# Patient Record
Sex: Male | Born: 1960 | Race: White | Hispanic: No | Marital: Married | Smoking: Former smoker
Health system: Southern US, Community
[De-identification: ages and names within clinical notes are randomized; demographics above are authoritative.]

## PROBLEM LIST (undated history)

## (undated) DIAGNOSIS — J449 Chronic obstructive pulmonary disease, unspecified: Secondary | ICD-10-CM

---

## 2008-01-01 ENCOUNTER — Inpatient Hospital Stay: Payer: Self-pay | Admitting: Internal Medicine

## 2009-03-20 ENCOUNTER — Emergency Department: Payer: Self-pay | Admitting: Emergency Medicine

## 2009-03-22 ENCOUNTER — Emergency Department: Payer: Self-pay | Admitting: Emergency Medicine

## 2009-12-09 ENCOUNTER — Emergency Department: Payer: Self-pay | Admitting: Unknown Physician Specialty

## 2010-02-05 ENCOUNTER — Inpatient Hospital Stay: Payer: Self-pay | Admitting: Internal Medicine

## 2012-01-30 IMAGING — CR DG CHEST 2V
1 series · 2 of 2 positions shown · non-contrast
Comparison: none

REASON FOR EXAM: wheezing
COMMENTS:   May transport without cardiac monitor

[Series 1: view not recorded · 0.17mm/px · 2 of 2 slices shown]
[im 1/2]
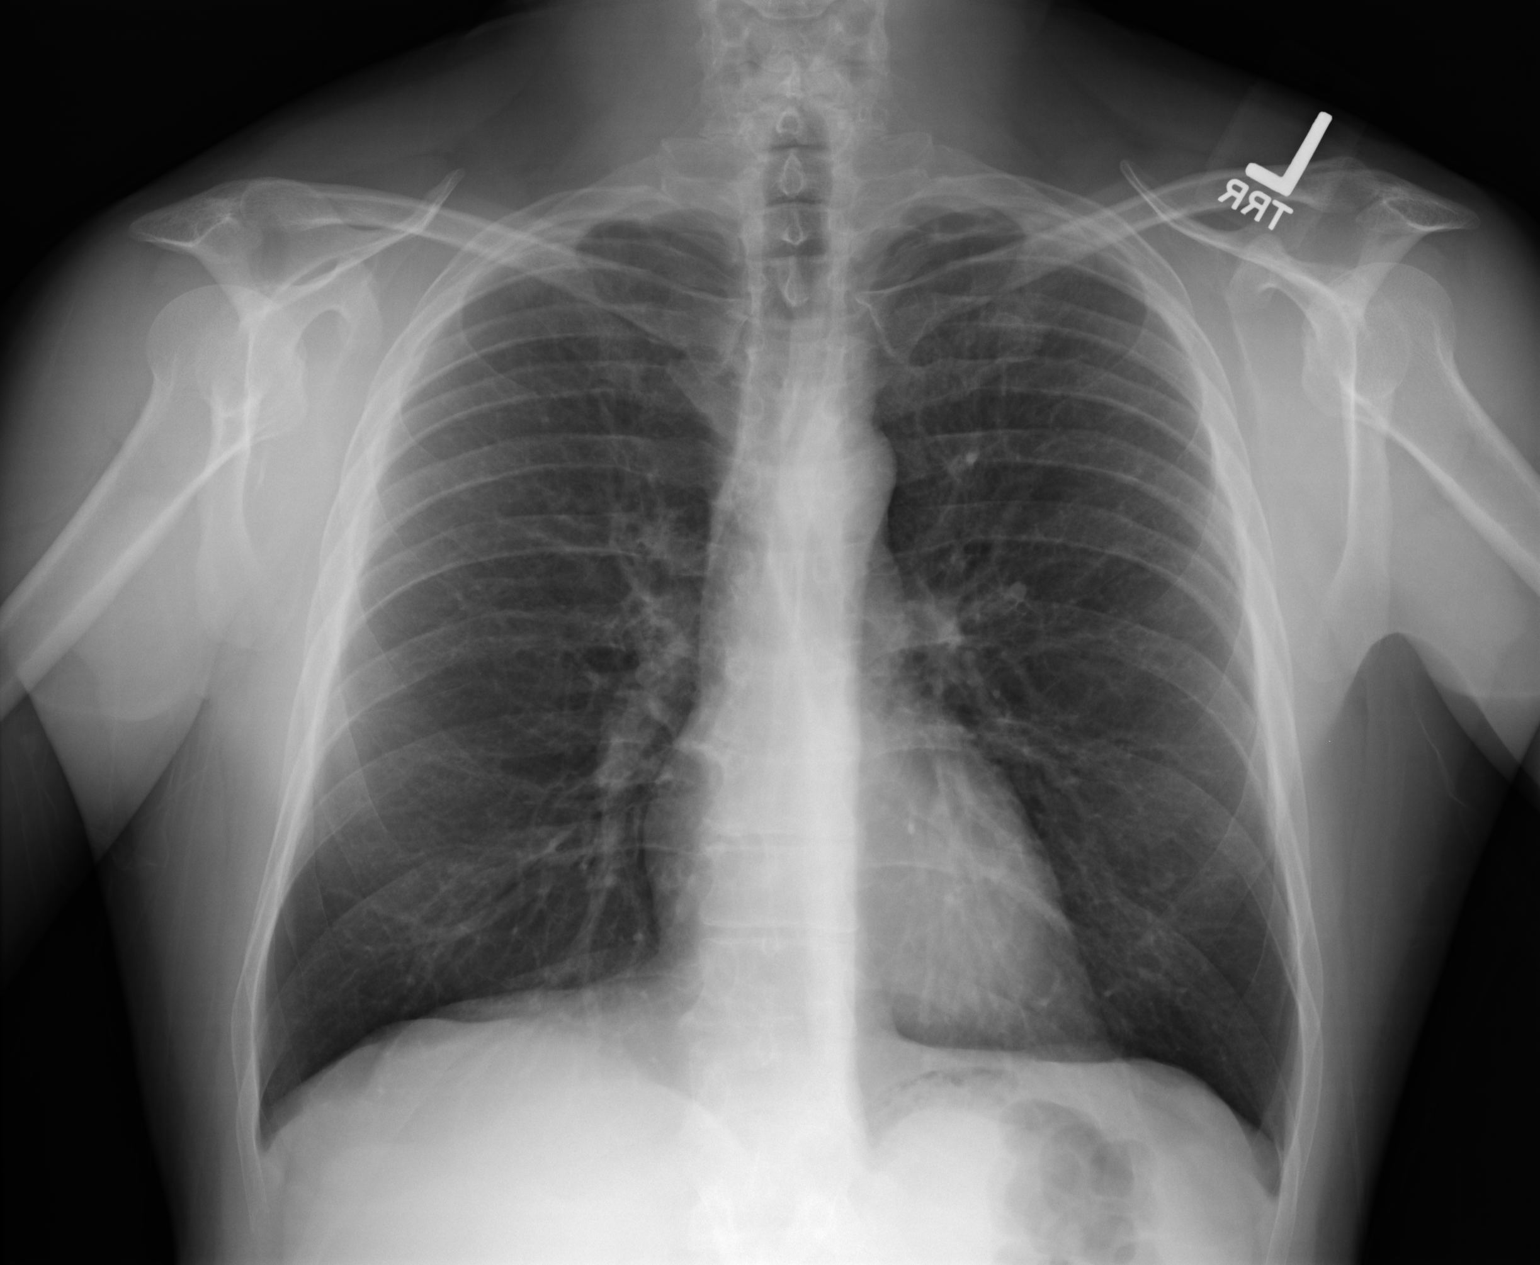
[im 2/2]
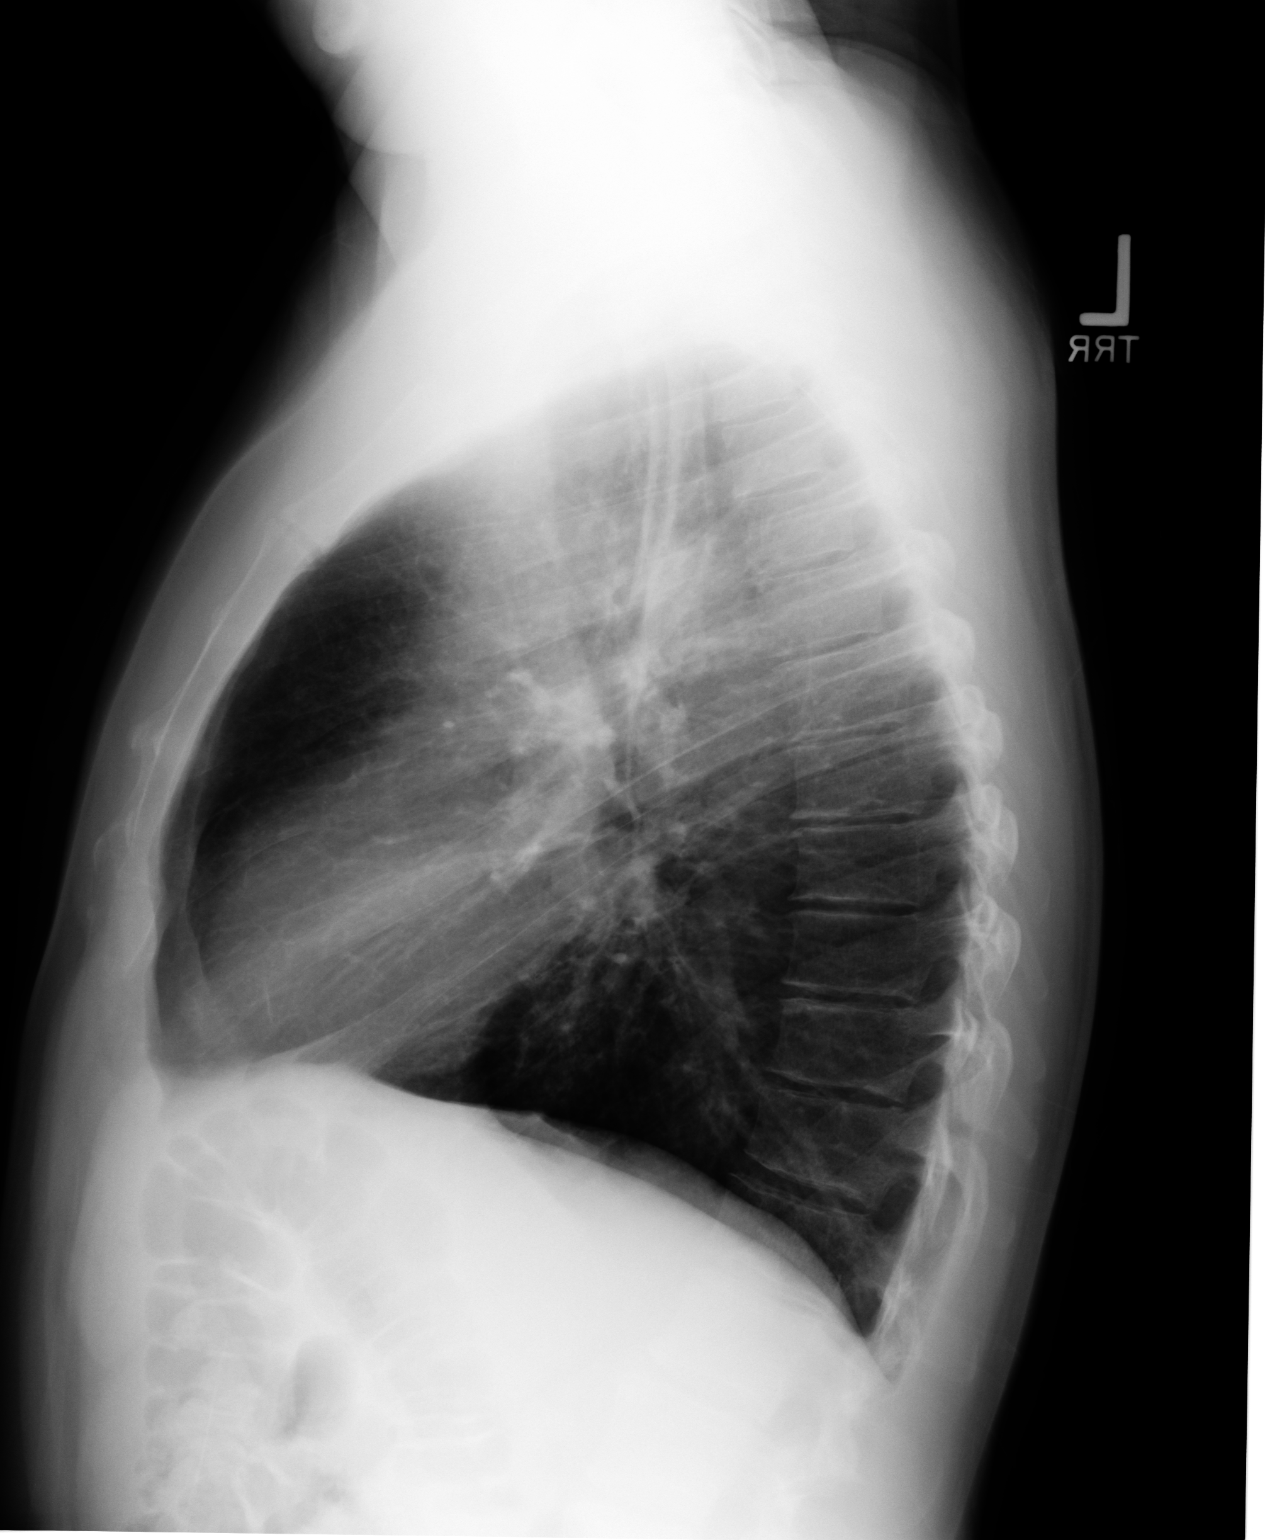

[2 of 2 positions shown; findings below may reference images not displayed]

PROCEDURE:     DXR - DXR CHEST PA (OR AP) AND LATERAL  - December 10, 2009  [DATE]

RESULT:     Comparison is made to the prior exam of 03/22/2009. The lung
fields are clear. The heart, mediastinal and osseous structures show no
significant abnormalities. The chest is hyperexpanded compatible with a
history of COPD or asthma.
IMPRESSION: 1. The lung fields are clear.
2. The chest is hyperexpanded compatible with a history of COPD or asthma.

## 2012-09-13 ENCOUNTER — Inpatient Hospital Stay: Payer: Self-pay | Admitting: Internal Medicine

## 2012-09-13 LAB — URINALYSIS, COMPLETE
Blood: NEGATIVE
Glucose,UR: NEGATIVE mg/dL (ref 0–75)
Leukocyte Esterase: NEGATIVE
Ph: 7 (ref 4.5–8.0)
Protein: NEGATIVE
RBC,UR: NONE SEEN /HPF (ref 0–5)
Squamous Epithelial: NONE SEEN
WBC UR: <1 /HPF (ref 0–5)

## 2012-09-13 LAB — CBC WITH DIFFERENTIAL/PLATELET
Basophil #: 0 10*3/uL (ref 0.0–0.1)
Basophil %: 0.7 %
Basophil %: 0.2 %
Eosinophil #: 1 10*3/uL — ABNORMAL HIGH (ref 0.0–0.7)
Eosinophil #: 0 10*3/uL (ref 0.0–0.7)
Eosinophil %: 0.1 %
HCT: 45.2 % (ref 40.0–52.0)
Lymphocyte #: 2.5 10*3/uL (ref 1.0–3.6)
MCHC: 34.8 g/dL (ref 32.0–36.0)
MCHC: 34.8 g/dL (ref 32.0–36.0)
MCV: 87 fL (ref 80–100)
Monocyte #: 0.7 x10 3/mm (ref 0.2–1.0)
Monocyte #: 0 x10 3/mm — ABNORMAL LOW (ref 0.2–1.0)
Monocyte %: 4.5 %
Monocyte %: 0.7 %
Neutrophil #: 10.7 10*3/uL — ABNORMAL HIGH (ref 1.4–6.5)
Neutrophil %: 71.4 %
Neutrophil %: 88.5 %
RDW: 12.8 % (ref 11.5–14.5)
WBC: 14.9 10*3/uL — ABNORMAL HIGH (ref 3.8–10.6)
WBC: 6.1 10*3/uL (ref 3.8–10.6)

## 2012-09-13 LAB — CK TOTAL AND CKMB (NOT AT ARMC)
CK, Total: 285 U/L — ABNORMAL HIGH (ref 35–232)
CK, Total: 264 U/L — ABNORMAL HIGH (ref 35–232)
CK-MB: 7.2 ng/mL — ABNORMAL HIGH (ref 0.5–3.6)
CK-MB: 14.1 ng/mL — ABNORMAL HIGH (ref 0.5–3.6)
CK-MB: 11.7 ng/mL — ABNORMAL HIGH (ref 0.5–3.6)

## 2012-09-13 LAB — COMPREHENSIVE METABOLIC PANEL
Albumin: 3.9 g/dL (ref 3.4–5.0)
Alkaline Phosphatase: 190 U/L — ABNORMAL HIGH (ref 50–136)
Anion Gap: 6 — ABNORMAL LOW (ref 7–16)
Calcium, Total: 9.5 mg/dL (ref 8.5–10.1)
Co2: 29 mmol/L (ref 21–32)
EGFR (Non-African Amer.): >60
Glucose: 126 mg/dL — ABNORMAL HIGH (ref 65–99)
Osmolality: 287 (ref 275–301)
Potassium: 4 mmol/L (ref 3.5–5.1)
SGPT (ALT): 20 U/L (ref 12–78)
Sodium: 143 mmol/L (ref 136–145)
Total Protein: 7.6 g/dL (ref 6.4–8.2)

## 2012-09-13 LAB — TROPONIN I
Troponin-I: <0.02 ng/mL
Troponin-I: 0.06 ng/mL — ABNORMAL HIGH

## 2012-09-13 LAB — APTT: Activated PTT: 29.7 secs (ref 23.6–35.9)

## 2012-09-14 LAB — BASIC METABOLIC PANEL
Anion Gap: 6 — ABNORMAL LOW (ref 7–16)
BUN: 11 mg/dL (ref 7–18)
Calcium, Total: 8.8 mg/dL (ref 8.5–10.1)
Chloride: 108 mmol/L — ABNORMAL HIGH (ref 98–107)
Co2: 27 mmol/L (ref 21–32)
Creatinine: 0.82 mg/dL (ref 0.60–1.30)
EGFR (African American): >60
Glucose: 139 mg/dL — ABNORMAL HIGH (ref 65–99)
Potassium: 3.7 mmol/L (ref 3.5–5.1)
Sodium: 141 mmol/L (ref 136–145)

## 2012-09-14 LAB — CBC WITH DIFFERENTIAL/PLATELET
Basophil #: 0 10*3/uL (ref 0.0–0.1)
Eosinophil #: 0 10*3/uL (ref 0.0–0.7)
HGB: 13.3 g/dL (ref 13.0–18.0)
Lymphocyte %: 10 %
MCHC: 34.8 g/dL (ref 32.0–36.0)
MCV: 87 fL (ref 80–100)
Monocyte #: 0.2 x10 3/mm (ref 0.2–1.0)
Monocyte %: 1.8 %
Neutrophil #: 8.2 10*3/uL — ABNORMAL HIGH (ref 1.4–6.5)
Platelet: 424 10*3/uL (ref 150–440)
RBC: 4.39 10*6/uL — ABNORMAL LOW (ref 4.40–5.90)

## 2012-09-14 LAB — APTT: Activated PTT: 41.5 secs — ABNORMAL HIGH (ref 23.6–35.9)

## 2013-11-17 ENCOUNTER — Emergency Department: Payer: Self-pay | Admitting: Internal Medicine

## 2014-06-22 ENCOUNTER — Emergency Department
Admit: 2014-06-22 | Disposition: A | Discharge status: Home / Self Care | Payer: Self-pay | Admitting: Physician Assistant

## 2014-07-11 NOTE — Discharge Summary (Signed)
PATIENT NAME:  Dale Page, Dale Page MR#:  [id number] DATE OF BIRTH:  02-08-1961  DATE OF ADMISSION:  09/13/2012 DATE OF DISCHARGE:  09/14/2012  ADMITTING DIAGNOSIS:  Chronic obstructive pulmonary disease exacerbation.   DISCHARGE DIAGNOSES: 1.  Acute on chronic respiratory failure.  2.  Chronic obstructive pulmonary disease exacerbation.  3.  Acute bronchitis.  4.  Non-Q-wave myocardial infarction.   DISCHARGE CONDITION:  Stable.   DISCHARGE MEDICATIONS:  The patient is to resume his outpatient medications which are:   1.  Albuterol MDIs 1 to 2 puffs every 4 to 6 hours as needed.  2.  DuoNebs one dose every six hours as needed.   3.  Symbicort 160/4.5 2 puffs twice daily.   4.  Prednisone, this is a new medication, 50 mg by mouth once on 09/15/2012, then taper by 5 mg every two days until stopped.  5.  Lisinopril 2.5 mg by mouth daily.  6. Aspirin 81 mg by mouth daily.  7.  Imdur 30 mg by mouth daily.  8.  Ipratropium 18 mcg inhalation daily.  9.  Levaquin 750 mg by mouth once daily for four more days.  10.  Atorvastatin 40 mg by mouth at bedtime.  11.  Metoprolol 25 mg by mouth twice daily.   HOME OXYGEN:  With portable tank at 2 liters of oxygen through nasal cannula as previously ordered.   DIET:  2 gram salt, low fat, low cholesterol, regular consistency.   ACTIVITY LIMITATIONS:  As tolerated.   FOLLOW-UP APPOINTMENT:  With Dr. Darrold Junker in two days after discharge.  Also [facility] primary care physician two days after discharge.   CONSULTANTS:  Cardiology, Dr. Darrold Junker.    RADIOLOGIC STUDIES:  Echocardiogram, results are still pending.  Chest x-ray, portable single view, 09/13/2012, revealed hyperinflation consistent with COPD, no evidence of CHF or pneumonia.   HOSPITAL COURSE:  The patient is a 54 year old Caucasian male with past medical history significant for history of COPD, oxygen dependent, presented to the hospital with complaints of shortness of breath.  Please  refer to Dr. Clarita Leber admission note on 09/13/2012.  On arrival to the hospital, the patient's temperature was 98.1, pulse 126, blood pressure 181/87, respiratory rate was 28 and O2 sats were 98% on BiPAP.  His physical exam revealed diffuse bilateral wheezing on lung exam and very poor air entry.  The patient's EKG done in the Emergency Room showed sinus tachy at 106 beats per minute.  Low voltage QRS, inferior infarct which was already cited before 09/13/2012 and nonspecific ST-T changes were noted.  The patient's lab data revealed normal BMP with glucose level of 126, otherwise BMP was unremarkable.  The patient's liver enzymes were unremarkable except alkaline phosphatase level was 190, elevated.  The patient's first set of cardiac enzymes revealed mild elevation of CK-MB fraction to 7.2 and normal troponin of 0.02, however second set showed elevation of CK total of 285, CK-MB was 14.1 and troponin was elevated to 0.06.  Third set revealed normalization of troponin to 0.03, MB fraction 11.7 and CK total of 264.  White blood cell count was elevated to 14.9, hemoglobin was 15.7 and platelet count was 439.  Absolute neutrophil count was also elevated at 10.7.  The patient's urinalysis was unremarkable.  ABGs were done on 40% FiO2, showed pH of 7.27, pCO2 was 58, pO2 was 172, saturation was 99.2% on BiPAP.  The patient was admitted to the hospital for further evaluation.  His cardiac enzymes were cycled and cardiology  consultation was obtained.  Cardiologist recommended to continue the patient on heparin for 24 to 48 hours and follow his symptoms.  He also recommended to proceed to cardiac catheterization if patient was agreeable, however the patient refuses that recommendation.  The patient was initiated on aspirin, Imdur, atorvastatin as well as metoprolol as well as low dose of lisinopril.  Echocardiogram was ordered, however results are still pending at the time of dictation.  The patient was recommended to  return back for further recommendations to Dr. Darrold JunkerParaschos' office in the next few days after discharge.  In regards to COPD exacerbation, the patient was continued on antibiotic therapy.  He is to continue antibiotics as well as steroid taper as well as inhalation therapy.  With this, he significantly improved.  On day of discharge he has no chest pains and his oxygenation is better.  On 09/14/2012 his temperature is 98.1, pulse was ranging from 80s to 110s, respiration rate was 16 to 17, blood pressure 124/69, saturation was 96% on 2 liters of oxygen through nasal cannula at rest.  It was felt that the patient is stable to be discharged home.  He is to follow up with cardiologist as well as his primary care physician in the next few days after discharge.  In regards to leukocytosis, the patient's white blood cell count normalized as time progressed.  On the day of discharge, 09/14/2012, the patient's white blood cell count is 9.3 with absolute neutrophil count of 8.2.   TIME SPENT:  40 minutes.   ____________________________ Katharina Caperima Ciela Mahajan, MD rv:ea D: 09/14/2012 16:19:00 ET T: 09/14/2012 22:46:23 ET JOB#: [id number]  cc: Katharina Caperima Elvert Cumpton, MD, <Dictator> Tishanna Dunford MD ELECTRONICALLY SIGNED 09/27/2012 11:36

## 2014-07-11 NOTE — H&P (Signed)
PATIENT NAME:  Dale Page, Dale Page MR#:  [id number] DATE OF BIRTH:  1960/10/25  DATE OF ADMISSION:  09/13/2012  PRIMARY CARE PHYSICIAN[facility]  [facility]   REFERRING PHYSICIAN:  Bayard Malesandolph Brown, M.D.   CHIEF COMPLAINT:  Shortness of breath.   HISTORY OF PRESENT ILLNESS:  Dale Page is a 54 year old with history of asthma as a child, severe COPD, presented to the Emergency Department with a complaint of severe shortness of breath.  The patient states ran out of his Spiriva and started to have a worsening of the shortness of breath to the point last night could not catch his breath.  The patient is currently unable to speak full sentences.  The patient's wife assists in adding details.  Has a cough with no productive sputum.  Denies having any fever.  Denies having any recent sick contacts.  At the time of the presentation, patient was in severe respiratory distress, received multiple breathing treatments and continued to be in severe respiratory distress.  Initial ABG, pH of 7.27, pCO2 of 58 and PaO2 of 172.  The patient was placed on BiPAP.  Chest x-ray does not show any obvious signs of any infection.   PAST MEDICAL HISTORY: 1.  COPD.  2.  Psoriasis.   PAST SURGICAL HISTORY:  None.   ALLERGIES:  No known drug allergies.   HOME MEDICATIONS: 1.  Symbicort 160/4.5 2 puffs 2 times a day.  2.  DuoNebs every 6 hours as needed.  3.  Albuterol 1 to 2 puffs every 4 to 6 hours as needed.   SOCIAL HISTORY:  Quit smoking in 2009.  Denies drinking alcohol or using illicit drugs.  Married, lives with his wife.   FAMILY HISTORY:  Of severe COPD, heart disease, both parents had COPD.   REVIEW OF SYSTEMS: CONSTITUTIONAL:  Generalized weakness, fatigue.  EYES:  No change in vision.  EARS, NOSE, THROAT:  No change in hearing.  No tinnitus.  RESPIRATORY:  Has cough, shortness of breath, most of the days, unable to walk long distances.  GASTROINTESTINAL:  Decreased appetite.  No nausea, vomiting,  diarrhea, abdominal pain.  GENITOURINARY:  No dysuria or hematuria.  ENDOCRINE:  No polyuria or polydipsia.  HEMATOLOGIC:  No easy bruising or bleeding.  SKIN:  No rash or lesions.  MUSCULOSKELETAL:  No joint pains or aches.  Complains of some mild back pain, started after coming to the Emergency Department.  NEUROLOGIC:  No numbness or weakness in any part of the body.  PSYCHIATRIC:  Denies having any depression, anxiety.   PHYSICAL EXAMINATION: GENERAL:  This is a thin built white male, looks much older than his stated age, lying down in the bed on BiPAP, is unable to speak full sentences.  VITAL SIGNS:  Temperature 98.1, pulse 126, blood pressure 181/87, respiratory rate of 28, oxygen saturation is 98% on BiPAP.  HEENT:  Head normocephalic, atraumatic.  Eyes, no sclerae icterus.  Conjunctivae normal.  Pupils equal and react to light.  Extraocular movements are intact.  Mucous membranes moist.  No pharyngeal erythema.  NECK:  Supple.  No lymphadenopathy.  No JVD.  No carotid bruit.   CHEST:  Has no focal tenderness.  There were multiple areas of poor air entry.  Bilateral diffuse wheezing.  Poor air reserve.   HEART:  S1 and S2, regular, tachycardia.  No pedal edema.  Pulses 2+.  ABDOMEN:  Bowel sounds plus.  Soft, nontender, nondistended.  No hepatosplenomegaly.  MUSCULOSKELETAL:  Good range of motion  in all the extremities.  SKIN:  No rash or lesions.   NEUROLOGIC:  The patient is alert, oriented to place, person and time.  Cranial nerves II through XII intact.  Motor 5 by 5 in upper and lower extremities.   LABORATORY DATA:  CBC, WBC of 14.9, hemoglobin 15.7, platelet count of 539.  CMP is completely within normal limits.  Troponin less than 0.02.  ABG, pH of 7.27, pCO2 of 58, PaO2 of 172.  Chest x-ray, one view, portable, hyperexpansion of the lungs.  No obvious infiltrates.   ASSESSMENT AND PLAN:  Dale Page is a 54 year old male that comes to the Emergency Department with severe  respiratory distress.  1.  Chronic obstructive pulmonary disease exacerbation.  Continue with the breathing treatments, Solu-Medrol.  Also add Levaquin to the regimen.  2.  Hypercarbic respiratory failure.  Continue with BiPAP.  3.  Keep the patient on DVT prophylaxis with Lovenox.   TIME SPENT:  45 minutes.    ____________________________ Susa Griffins, MD pv:ea D: 09/13/2012 05:31:14 ET T: 09/13/2012 06:05:04 ET JOB#: [id number]  cc: Susa Griffins, MD, <Dictator> Susa Griffins MD ELECTRONICALLY SIGNED 09/16/2012 8:39

## 2014-07-11 NOTE — Consult Note (Signed)
PATIENT NAME:  Dale Page, Orlanda MR#:  [id number] DATE OF BIRTH:  06-13-1960  DATE OF CONSULTATION:  09/13/2012  CONSULTING PHYSICIAN:  Marcina MillardAlexander Xadrian Craighead, MD  REFERRING PHYSICIAN:  Dr.  Winona LegatoVaickute   CHIEF COMPLAINT:  Shortness of breath.   REASON FOR CONSULTATION:  Consultation requested for evaluation of elevated troponin and cardiac enzymes.   HISTORY OF PRESENT ILLNESS:  The patient is a 54 year old gentleman with known history of COPD. The patient apparently recently ran out of his COPD medications, including Spiriva and Symbicort, and has had a 2 to 3-day history of worsening shortness of breath. [facility] with respiratory distress. Initial arterial blood gas revealed pH of 7.27, pCO2 of 58,  PaO2 of 172. The patient was treated with BiPAP as well as intravenous steroids, antibiotics and breathing treatments, with improvement in his breathing. The patient denies chest pain. The second troponin was 0.06. CPK and MB were 285 and 14.1, respectively.   PAST MEDICAL HISTORY:  COPD.    MEDICATIONS:  Symbicort 160/4.5,  2 puffs b.i.d. DuoNeb q. 6 p.r.n.  Albuterol 1 to 2 puffs q.4 to 6 hours p.r.n.   SOCIAL HISTORY:  The patient is married and lives with his wife. He quit tobacco abuse in 2009.   FAMILY HISTORY:  No immediate family history of coronary artery disease or myocardial infarction.   REVIEW OF SYSTEMS:   CONSTITUTIONAL:  No fever or chills.  EYES:  No blurry vision.  EARS:  No hearing loss.  RESPIRATORY:  Shortness of breath as described above.  CARDIOVASCULAR:  No chest pain.  GASTROINTESTINAL:  No nausea, vomiting, diarrhea.  GENITOURINARY:  No dysuria or hematuria.  ENDOCRINE:  No polyuria or polydipsia.  MUSCULOSKELETAL:  No arthralgias or myalgias.  NEUROLOGICAL:  No focal muscle weakness or numbness.  PSYCHOLOGICAL:  No depression or anxiety.   PHYSICAL EXAMINATION: VITAL SIGNS:  Blood pressure 99/63, pulse 106, respirations 20, temperature  98, pulse ox 96%.  HEENT:  Pupils equal and reactive to light and accommodation.  NECK:  Supple, without thyromegaly.  LUNGS:  Revealed decreased breath sounds bilaterally.  CARDIOVASCULAR:  Normal JVP. Normal PMI. Regular rate and rhythm. Normal S1, S2. No appreciable gallop, murmur, rub.  ABDOMEN:  Soft, nontender. Pulses were intact bilaterally.  MUSCULOSKELETAL:  Normal muscle tone.  NEUROLOGIC:  The patient is alert and oriented x 3. Motor and sensory both grossly intact   IMPRESSION: A 54 year old gentleman who presents with chronic obstructive pulmonary disease flare after running out of medications, who appears to be clinically improved after intravenous steroids, antibiotics and breathing treatments. The patient appears to be ruling in for a non-ST elevation myocardial infarction by both troponin and CPK-MB biomarkers. We discussed at length about the potential benefits of cardiac catheterization, but patient appears to be reluctant at this time.   RECOMMENDATIONS: 1.  Agree with overall current therapy.   2. Proceed with cardiac catheterization, with selective coronary arteriography. The patient currently reluctant to proceed, and wishes to defer until later date if necessary.   3.  Would continue heparin for 24 to 48 hours, if patient wishes to defer cardiac catheterization.   4.  Consider a 2-D echocardiogram to evaluate left ventricular function.    ____________________________ Marcina MillardAlexander Delainee Tramel, MD ap:mr D: 09/13/2012 16:49:47 ET T: 09/13/2012 21:07:10 ET JOB#: [id number]  cc: Marcina MillardAlexander Delois Silvester, MD, <Dictator> Marcina MillardALEXANDER Shareef Eddinger MD ELECTRONICALLY SIGNED 09/15/2012 10:09

## 2014-11-03 IMAGING — CR DG CHEST 1V PORT
1 series · 1 of 1 positions shown · non-contrast
Comparison: none

REASON FOR EXAM: Chest pain
COMMENTS:

[ap]
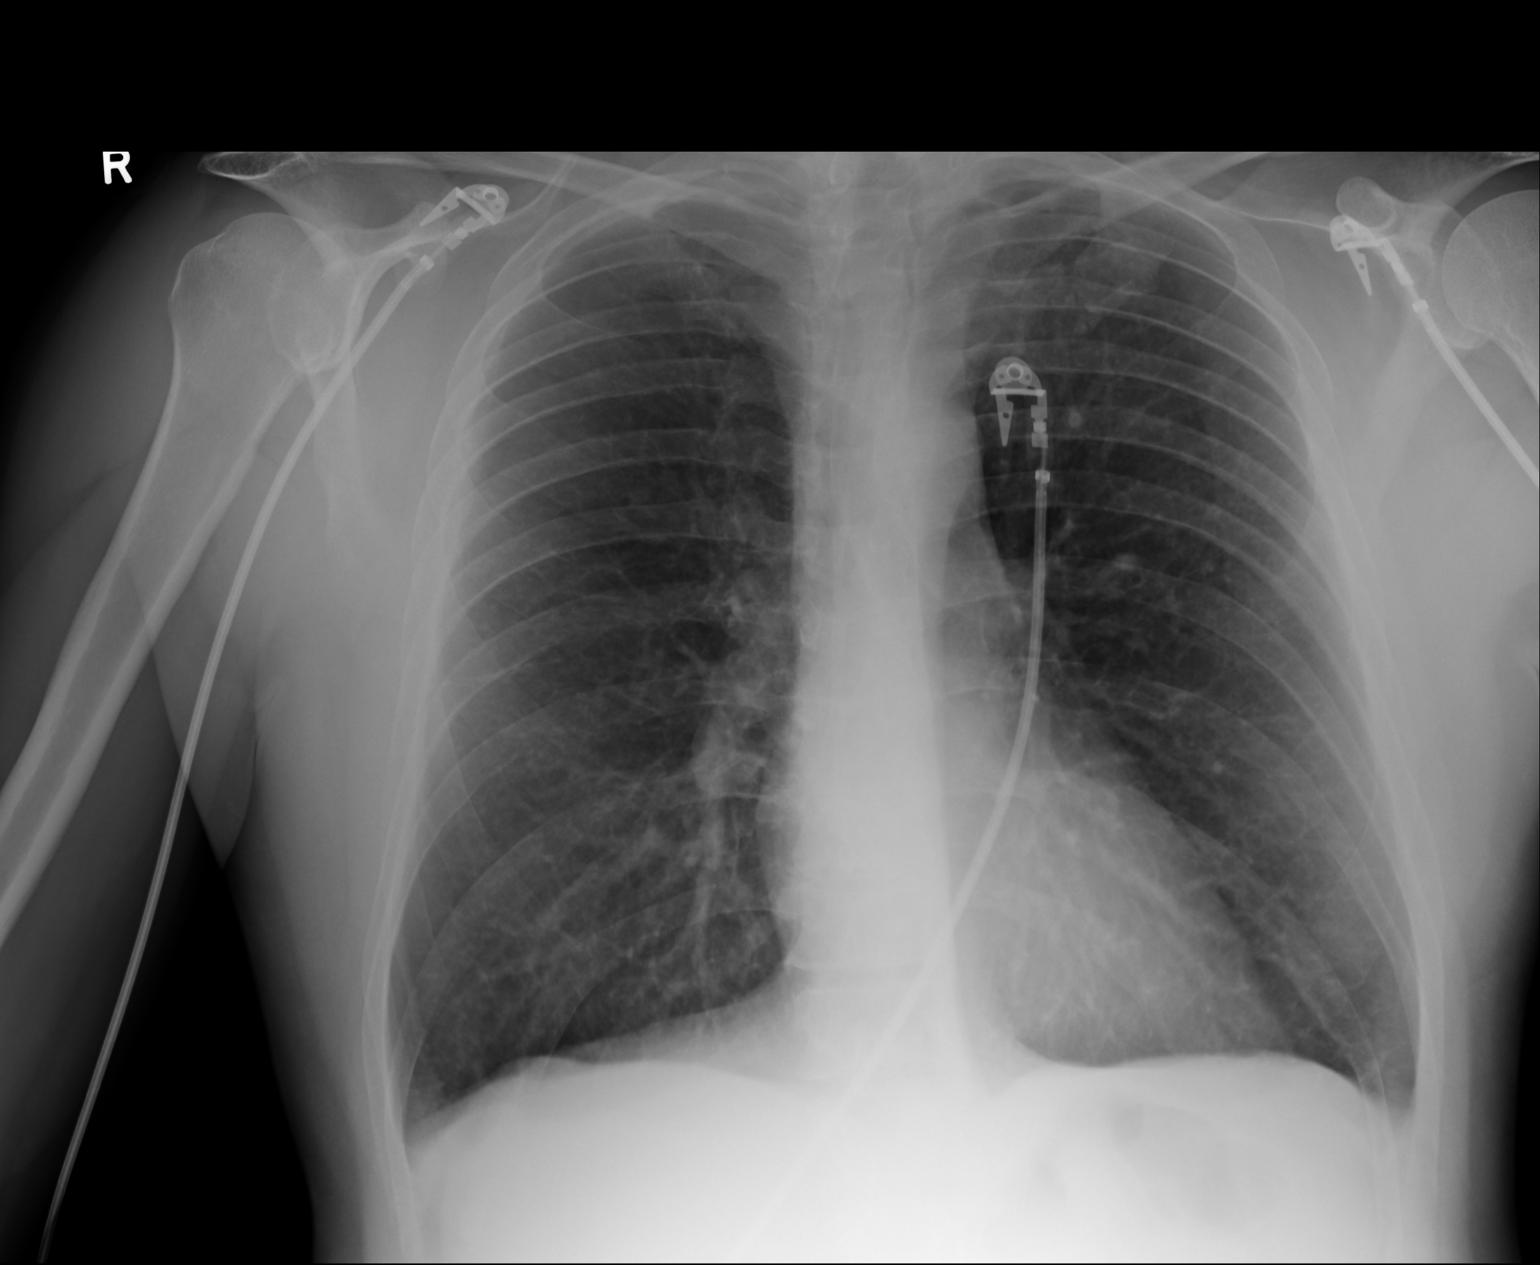

[1 of 1 positions shown; findings below may reference images not displayed]

PROCEDURE:     DXR - DXR PORTABLE CHEST SINGLE VIEW  - September 13, 2012  [DATE]

RESULT:     Comparison is made to the study February 05, 2010.

The lungs are hyperinflated with hemidiaphragm flattening. There is no focal
infiltrate. Tiny  stable nodular densities are present in the left mid and
upper lung. The cardiac silhouette is normal in size. The mediastinum is
normal in width. There is no pleural effusion or pneumothorax. The bony
thorax exhibits no acute abnormality.
IMPRESSION: There is hyperinflation consistent with COPD. There is no
evidence of CHF nor of pneumonia. A followup PA and lateral chest x-ray may
be useful if the patient's symptoms persist and remain unexplained.

[REDACTED]

## 2016-07-23 ENCOUNTER — Emergency Department
Admission: EM | Admit: 2016-07-23 | Discharge: 2016-07-23 | Disposition: A | Discharge status: Home / Self Care | Payer: Medicare HMO | Attending: Emergency Medicine | Admitting: Emergency Medicine

## 2016-07-23 ENCOUNTER — Encounter: Payer: Self-pay | Admitting: Emergency Medicine

## 2016-07-23 ENCOUNTER — Emergency Department: Payer: Medicare HMO

## 2016-07-23 DIAGNOSIS — J449 Chronic obstructive pulmonary disease, unspecified: Secondary | ICD-10-CM | POA: Diagnosis not present

## 2016-07-23 DIAGNOSIS — Z87891 Personal history of nicotine dependence: Secondary | ICD-10-CM | POA: Insufficient documentation

## 2016-07-23 DIAGNOSIS — R05 Cough: Secondary | ICD-10-CM | POA: Diagnosis present

## 2016-07-23 HISTORY — DX: Chronic obstructive pulmonary disease, unspecified: J44.9

## 2016-07-23 MED ORDER — PSEUDOEPH-BROMPHEN-DM 30-2-10 MG/5ML PO SYRP: 5.0000 mL | ORAL_SOLUTION | Freq: Four times a day (QID) | ORAL | 0 refills | Status: AC | PRN

## 2016-07-23 MED ORDER — TRAMADOL HCL 50 MG PO TABS
50.0000 mg | ORAL_TABLET | Freq: Once | ORAL | Status: AC
  Administered 2016-07-23: 50 mg via ORAL
  Filled 2016-07-23: qty 1

## 2016-07-23 MED ORDER — TRAMADOL HCL 50 MG PO TABS: 50.0000 mg | ORAL_TABLET | Freq: Four times a day (QID) | ORAL | 0 refills | Status: AC | PRN

## 2016-07-23 NOTE — ED Notes (Signed)
NAD noted at time of D/C. Pt denies questions or concerns. Pt ambulatory to the lobby at this time.  

## 2016-07-23 NOTE — ED Triage Notes (Signed)
States has had more SOB than usual x 2 months. Has history of COPD. States too steroid pack that he finished last Tuesday. States this helped him but that he usually needs a 2 week course and that has increased resp tightness when he went off the one week course of steroids. States he has not had fevers. Denies chest pain. Cough productive white.

## 2016-07-23 NOTE — ED Provider Notes (Signed)
[facility] Emergency Department Provider Note   ____________________________________________   None    (approximate)  I have reviewed the triage vital signs and the nursing notes.   HISTORY  Chief Complaint Cough    HPI Dale Peaony Nghiem Sr. is a 56 y.o. male patient complaining of shortness of breath for 2 months. Patient has history of COPD. Patient state he has improved with steroids given for 1 week last 2 months. Patient state he believes that he needs 2 weeks of steroids. Patient denies fever at this complaint. Patient denies chest pain. Patient state he has a nonproductive/productive whitish cough.Patient on a maintenance dose of 10 mg of prednisone daily.   Past Medical History:  Diagnosis Date  . COPD (chronic obstructive pulmonary disease) (HCC)     There are no active problems to display for this patient.   History reviewed. No pertinent surgical history.  Prior to Admission medications   Medication Sig Start Date End Date Taking? Authorizing Provider  brompheniramine-pseudoephedrine-DM 30-2-10 MG/5ML syrup Take 5 mLs by mouth 4 (four) times daily as needed. 07/23/16   Joni ReiningSmith, Khiem Gargis K, PA-C  traMADol (ULTRAM) 50 MG tablet Take 1 tablet (50 mg total) by mouth every 6 (six) hours as needed for moderate pain. 07/23/16   Joni ReiningSmith, Pharaoh Pio K, PA-C    Allergies Patient has no known allergies.  No family history on file.  Social History Social History  Substance Use Topics  . Smoking status: Former Games developermoker  . Smokeless tobacco: Not on file  . Alcohol use Not on file    Review of Systems  Constitutional: No fever/chills Eyes: No visual changes. ENT: No sore throat. Cardiovascular: Denies chest pain. Respiratory: States shortness of breath . Gastrointestinal: No abdominal pain.  No nausea, no vomiting.  No diarrhea.  No constipation. Genitourinary: Negative for dysuria. Musculoskeletal: Negative for back pain. Skin: Negative for  rash. Neurological: Negative for headaches, focal weakness or numbness.   ____________________________________________   PHYSICAL EXAM:  VITAL SIGNS: ED Triage Vitals  Enc Vitals Group     BP 07/23/16 1217 (!) 155/82     Pulse Rate 07/23/16 1217 77     Resp 07/23/16 1217 20     Temp 07/23/16 1217 98.1 F (36.7 C)     Temp Source 07/23/16 1217 Oral     SpO2 07/23/16 1217 93 %     Weight 07/23/16 1220 150 lb (68 kg)     Height 07/23/16 1220 5\' 1"  (1.549 m)     Head Circumference --      Peak Flow --      Pain Score 07/23/16 1216 7     Pain Loc --      Pain Edu? --      Excl. in GC? --     Constitutional: Alert and oriented. Well appearing and in no acute distress. Eyes: Conjunctivae are normal. PERRL. EOMI. Head: Atraumatic. Nose: No congestion/rhinnorhea. Mouth/Throat: Mucous membranes are moist.  Oropharynx non-erythematous. Neck: No stridor.  No cervical spine tenderness to palpation. Hematological/Lymphatic/Immunilogical: No cervical lymphadenopathy. Cardiovascular: Normal rate, regular rhythm. Grossly normal heart sounds.  Good peripheral circulation. Respiratory: Normal respiratory effort.  No retractions. Lungs CTAB. Gastrointestinal: Soft and nontender. No distention. No abdominal bruits. No CVA tenderness. Musculoskeletal: No lower extremity tenderness nor edema.  No joint effusions. Neurologic:  Normal speech and language. No gross focal neurologic deficits are appreciated. No gait instability. Skin:  Skin is warm, dry and intact. No rash noted. Psychiatric: Mood and affect  are normal. Speech and behavior are normal.  ____________________________________________   LABS (all labs ordered are listed, but only abnormal results are displayed)  Labs Reviewed - No data to display ____________________________________________  EKG   ____________________________________________  RADIOLOGY  Chest x-ray finding consistent with  COPD. ____________________________________________   PROCEDURES  Procedure(s) performed: None  Procedures  Critical Care performed: No  ____________________________________________   INITIAL IMPRESSION / ASSESSMENT AND PLAN / ED COURSE  Pertinent labs & imaging results that were available during my care of the patient were reviewed by me and considered in my medical decision making (see chart for details).  COPD with no underlying disease process at this time. Discussed x-ray findings and treatment options with patient. Patient refused injectable steroids secondary to an aversion to needles. Discussed rationale for not start another round of steroids when his last tapered dose was finished only 2 days ago. Advised to continue maintenance dose given a prescription for Bromfed-DM. Patient also given 3 days of tramadol secondary to chest wall pain from continual cough. Patient advised to follow-up with pulmonologist by calling for an appointment in 2 days. Return to ER if condition worsens.      ____________________________________________   FINAL CLINICAL IMPRESSION(S) / ED DIAGNOSES  Final diagnoses:  Chronic obstructive pulmonary disease with bronchospasm (HCC)      NEW MEDICATIONS STARTED DURING THIS VISIT:  New Prescriptions   BROMPHENIRAMINE-PSEUDOEPHEDRINE-DM 30-2-10 MG/5ML SYRUP    Take 5 mLs by mouth 4 (four) times daily as needed.   TRAMADOL (ULTRAM) 50 MG TABLET    Take 1 tablet (50 mg total) by mouth every 6 (six) hours as needed for moderate pain.     Note:  This document was prepared using Dragon voice recognition software and may include unintentional dictation errors.    Joni Reining, PA-C 07/23/16 1321    Governor Rooks, MD 07/23/16 [phone]

## 2018-09-12 IMAGING — CR DG CHEST 2V
1 series · 2 of 2 positions shown · non-contrast
Comparison: 09/13/2012

CLINICAL DATA: Cough and shortness of breath

EXAM:
CHEST  2 VIEW

[Series 1: dg chest 2 view · 0.14mm/px · 2 of 2 slices shown]
[im 1/2]
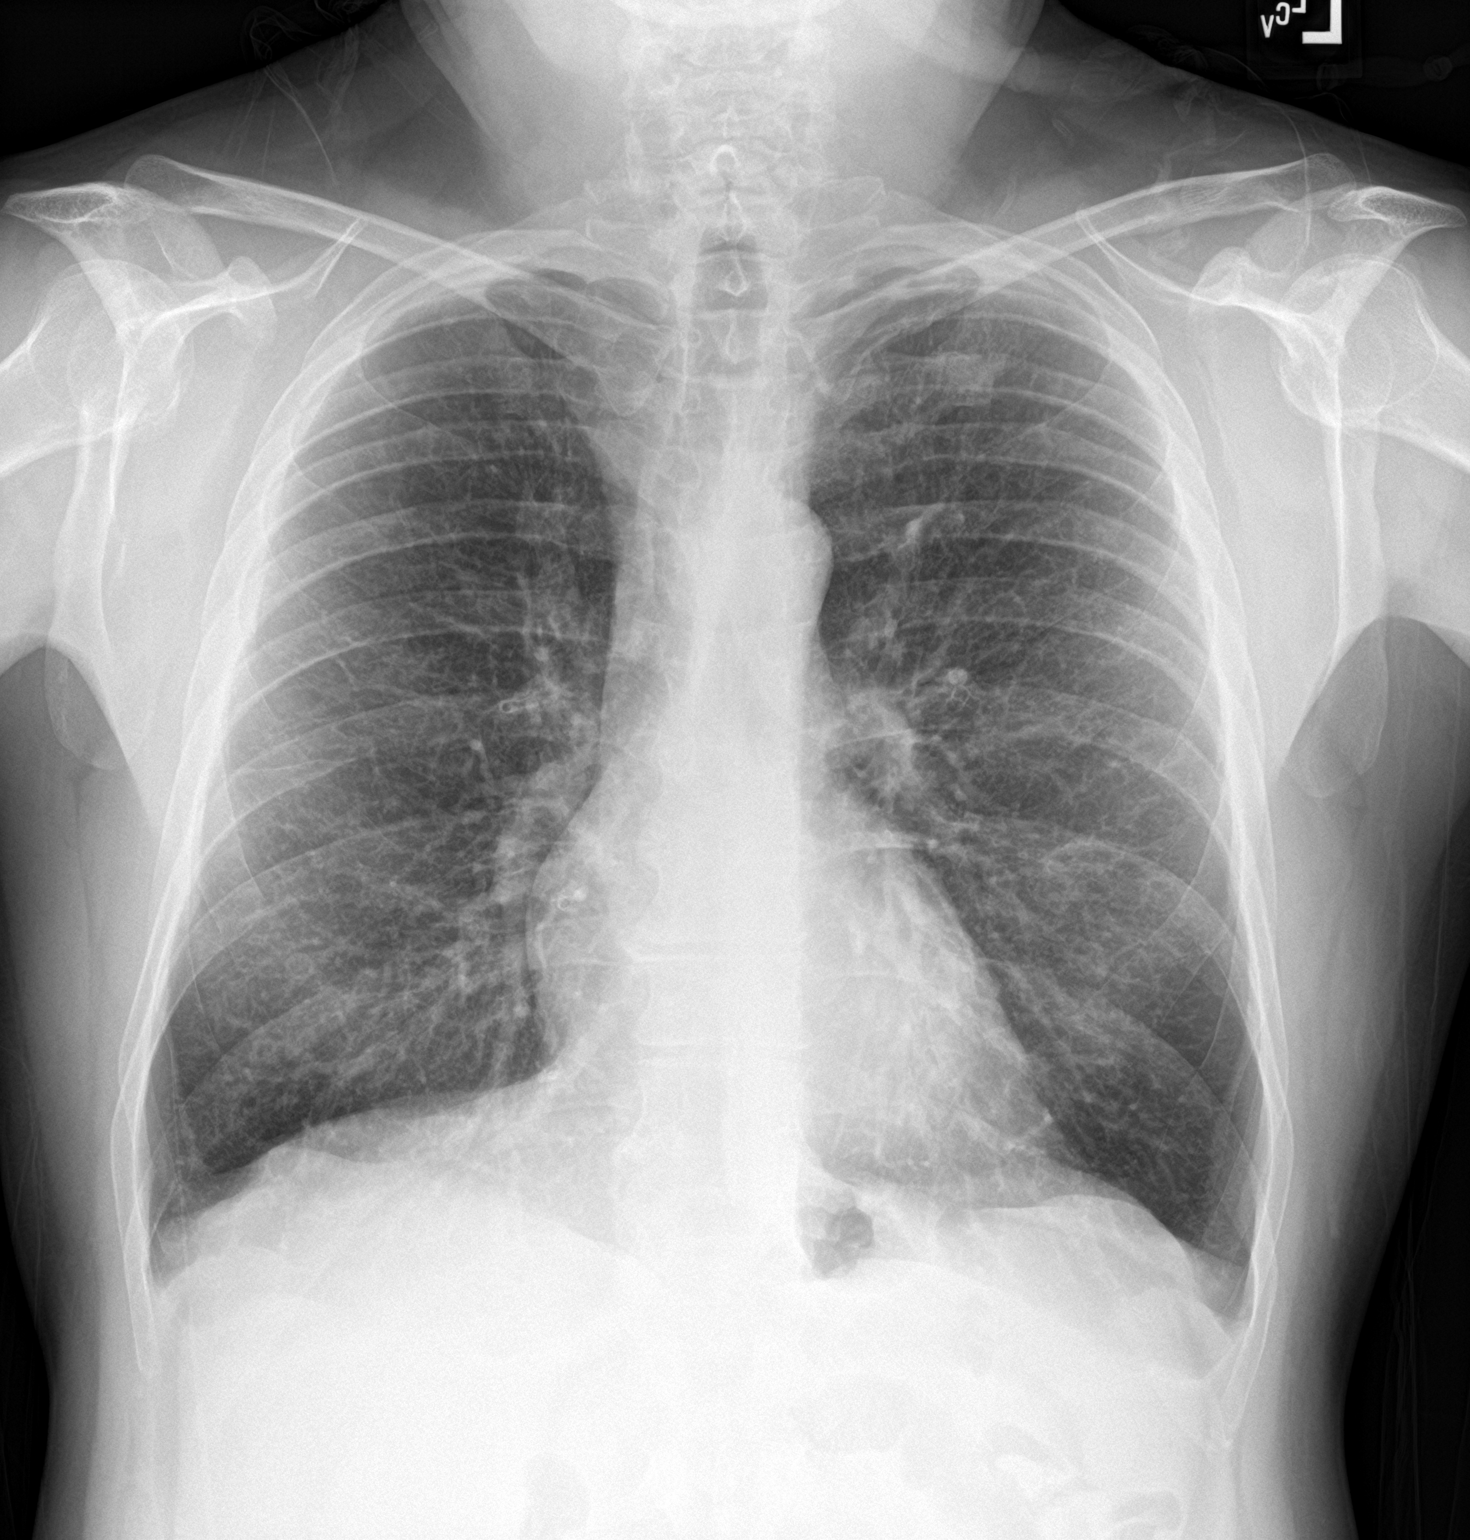
[im 2/2]
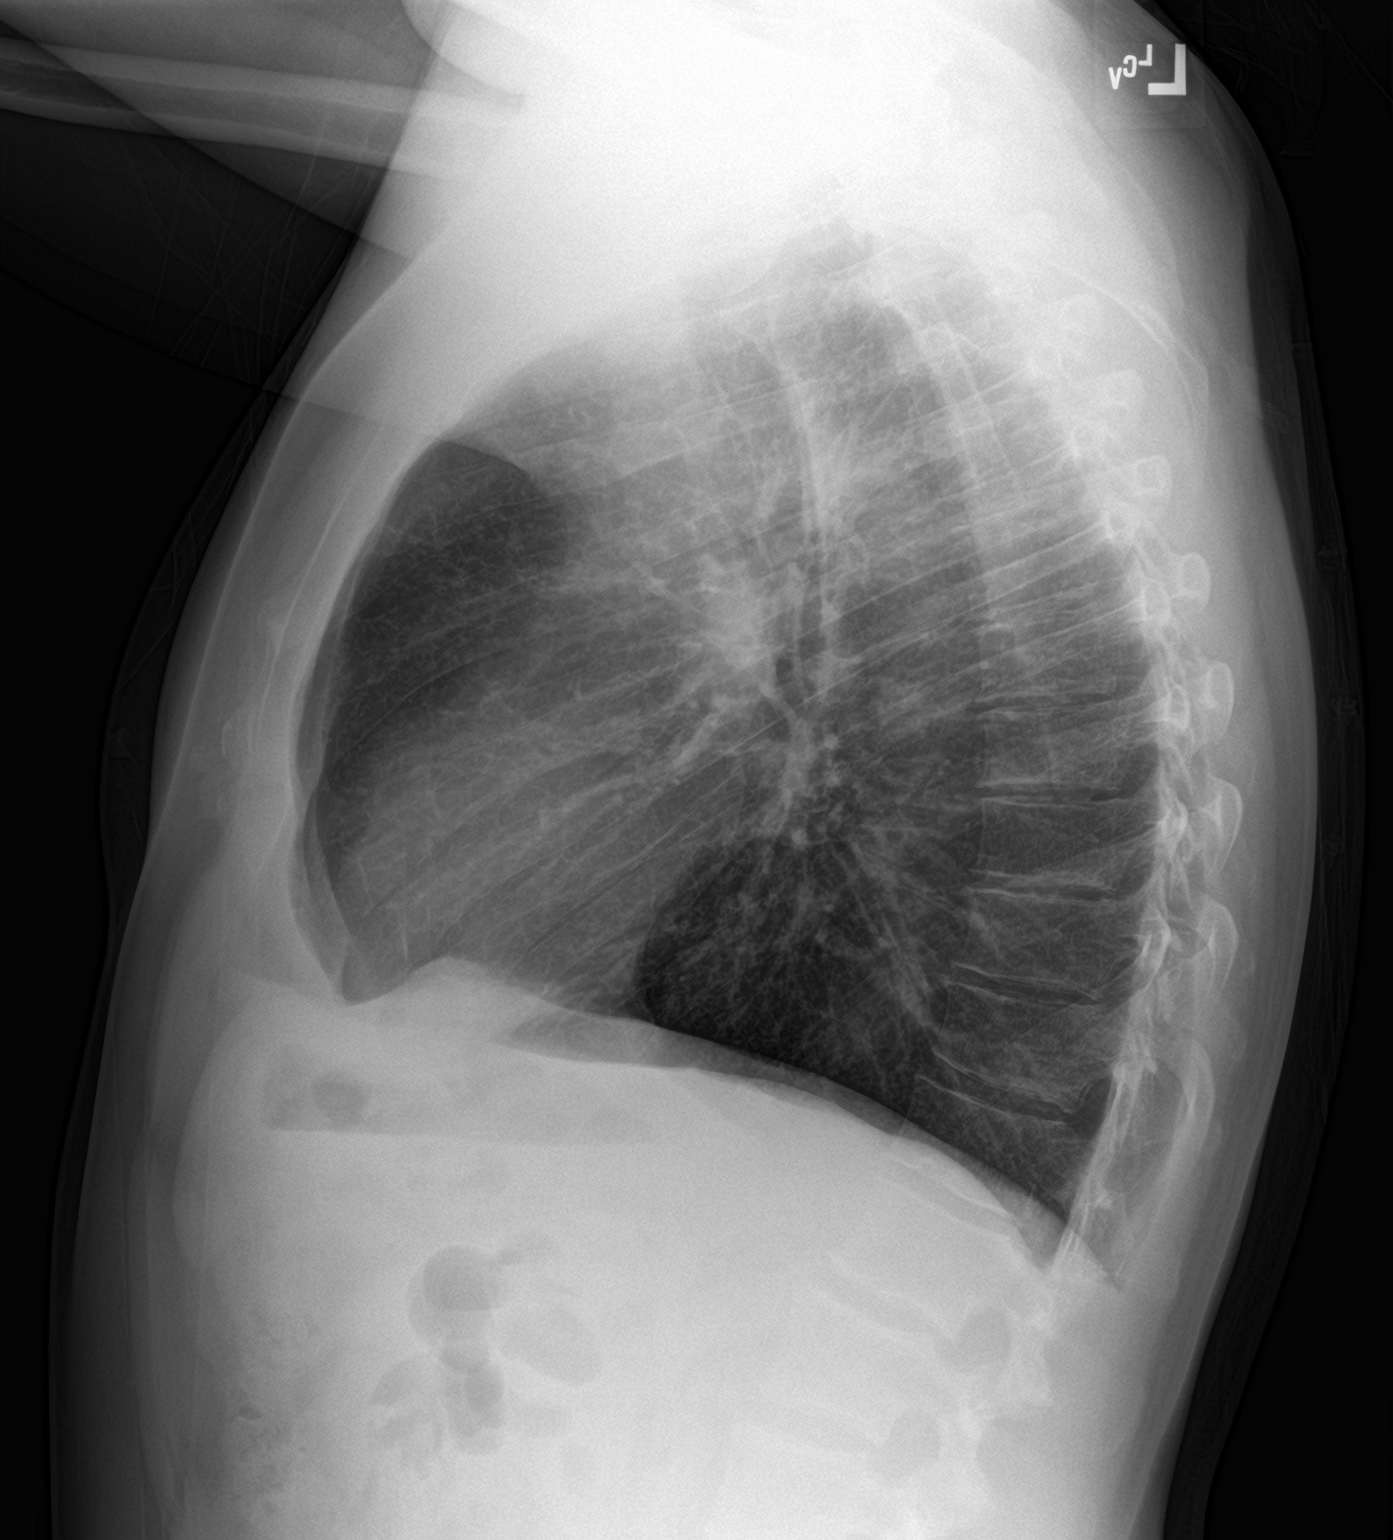

[2 of 2 positions shown; findings below may reference images not displayed]

FINDINGS: Chronic hyperinflation and airway thickening. There is no edema,
consolidation, effusion, or pneumothorax. Normal heart size and
mediastinal contours.
IMPRESSION: COPD without acute superimposed finding.
